# Patient Record
Sex: Female | Born: 1975 | Race: White | Hispanic: No | Marital: Married
Health system: Southern US, Community
[De-identification: ages and names within clinical notes are randomized; demographics above are authoritative.]

---

## 2018-04-03 ENCOUNTER — Other Ambulatory Visit: Payer: Self-pay | Admitting: Family Medicine

## 2018-04-03 DIAGNOSIS — Z8639 Personal history of other endocrine, nutritional and metabolic disease: Secondary | ICD-10-CM

## 2018-04-16 ENCOUNTER — Ambulatory Visit
Admission: RE | Admit: 2018-04-16 | Discharge: 2018-04-16 | Disposition: A | Discharge status: Home / Self Care | Payer: BLUE CROSS/BLUE SHIELD | Source: Ambulatory Visit | Attending: Family Medicine | Admitting: Family Medicine

## 2018-04-16 DIAGNOSIS — Z8639 Personal history of other endocrine, nutritional and metabolic disease: Secondary | ICD-10-CM

## 2019-10-31 ENCOUNTER — Ambulatory Visit: Payer: Self-pay | Attending: Internal Medicine

## 2019-10-31 DIAGNOSIS — Z23 Encounter for immunization: Secondary | ICD-10-CM | POA: Insufficient documentation

## 2019-10-31 NOTE — Progress Notes (Signed)
   Cov[facility] Vaccination [facility]  Name:  Ross Hefferan    MRN: [id number] DOB: December 09, 1975  10/31/2019  Ms. Saephanh was observed post Covid-19 immunization for 15 minutes without incidence. She was provided with Vaccine Information Sheet and instruction to access the V-Safe system.   Ms. Horseman was instructed to call 911 with any severe reactions post vaccine: Marland Kitchen Difficulty breathing  . Swelling of your face and throat  . A fast heartbeat  . A bad rash all over your body  . Dizziness and weakness    Immunizations Administered    Name Date Dose VIS Date Route   Pfizer COVID-19 Vaccine 10/31/2019  3:42 PM 0.3 mL 08/14/2019 Intramuscular   Manufacturer: [employer], [employer]   Lot: [id number]   NDC: [address]-1

## 2019-11-21 ENCOUNTER — Ambulatory Visit: Payer: Self-pay | Attending: Internal Medicine

## 2019-11-21 ENCOUNTER — Other Ambulatory Visit: Payer: Self-pay

## 2019-11-21 DIAGNOSIS — Z23 Encounter for immunization: Secondary | ICD-10-CM

## 2019-11-21 NOTE — Progress Notes (Signed)
   Covid-19 Vaccination Clinic  Name:  Zaara Sprowl    MRN: [id number] DOB: Dec 02, 1975  11/21/2019  Ms. Lobdell was observed post Covid-19 immunization for 15 minutes without incident. She was provided with Vaccine Information Sheet and instruction to access the V-Safe system.   Ms. Shindler was instructed to call 911 with any severe reactions post vaccine: Marland Kitchen Difficulty breathing  . Swelling of face and throat  . A fast heartbeat  . A bad rash all over body  . Dizziness and weakness   Immunizations Administered    Name Date Dose VIS Date Route   Pfizer COVID-19 Vaccine 11/21/2019  8:37 AM 0.3 mL 08/14/2019 Intramuscular   Manufacturer: [employer], [employer]   Lot: [id number]   NDC: [address]-1

## 2020-05-13 IMAGING — US US THYROID
1 series · 14 of 25 positions shown · non-contrast
Comparison: None.

CLINICAL DATA: Nodule

EXAM:
THYROID ULTRASOUND
TECHNIQUE: Ultrasound examination of the thyroid gland and adjacent soft
tissues was performed.

[Series 1: us thyroid · 0.04mm/px · 14 of 51 slices shown]
[im 1/51]
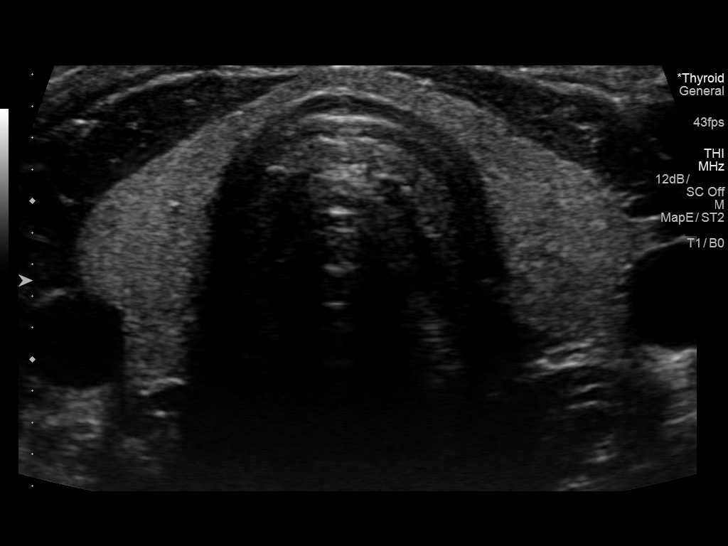
[im 5/51]
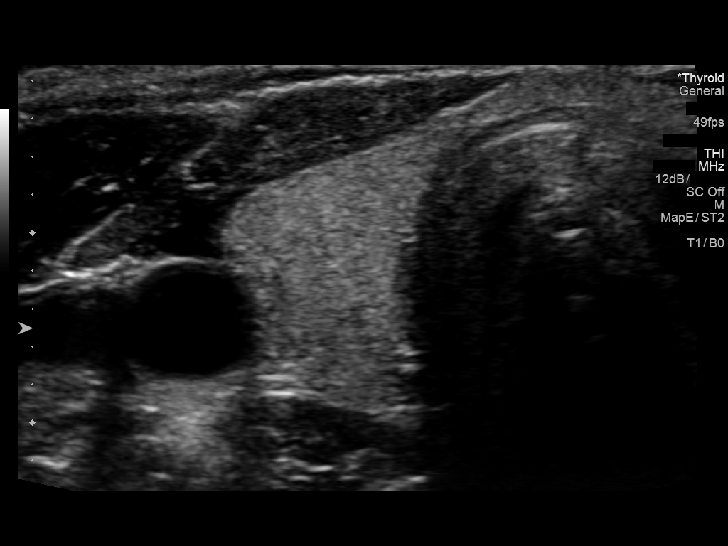
[im 9/51]
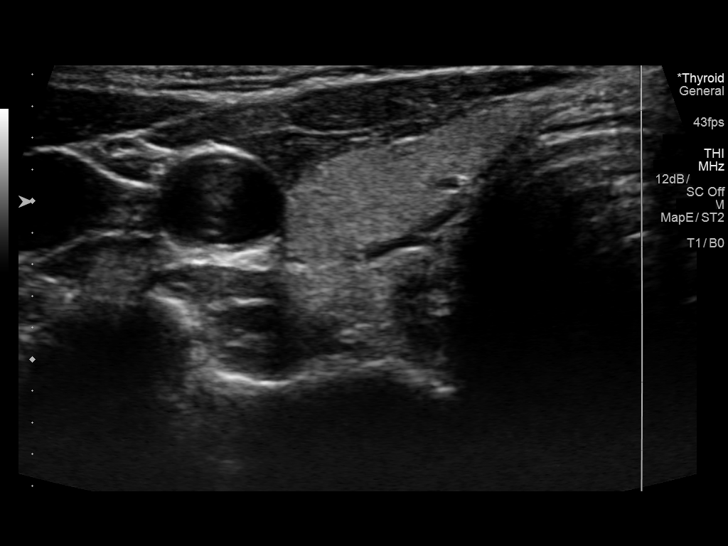
[im 13/51]
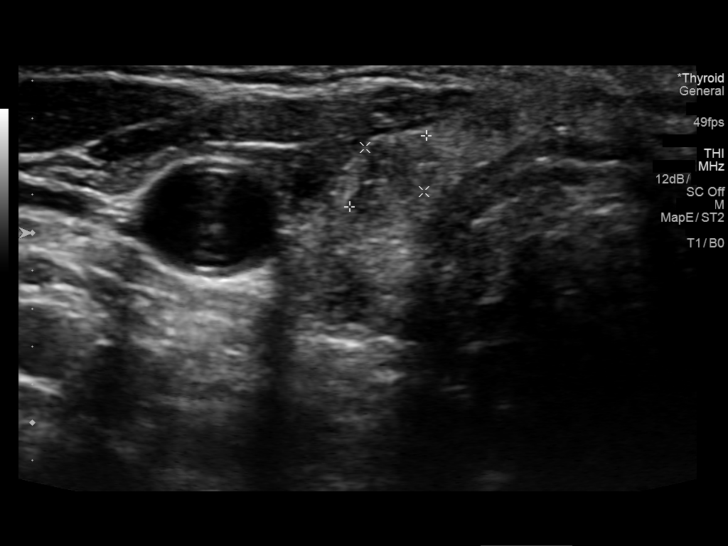
[im 17/51]
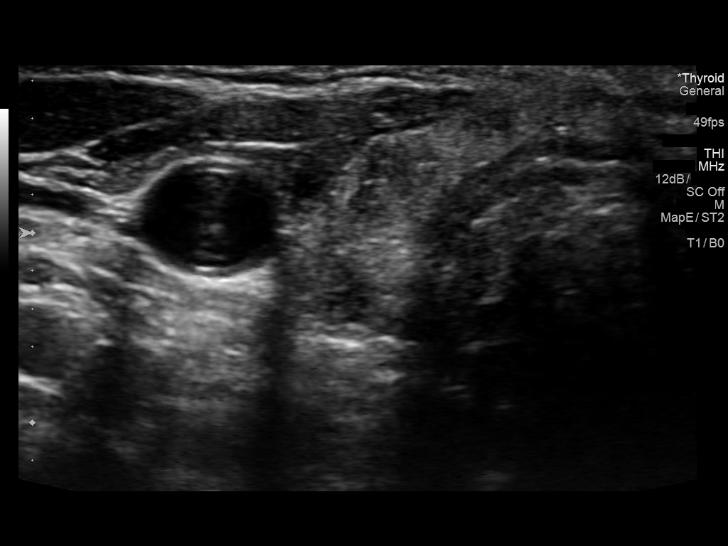
[im 19/51]
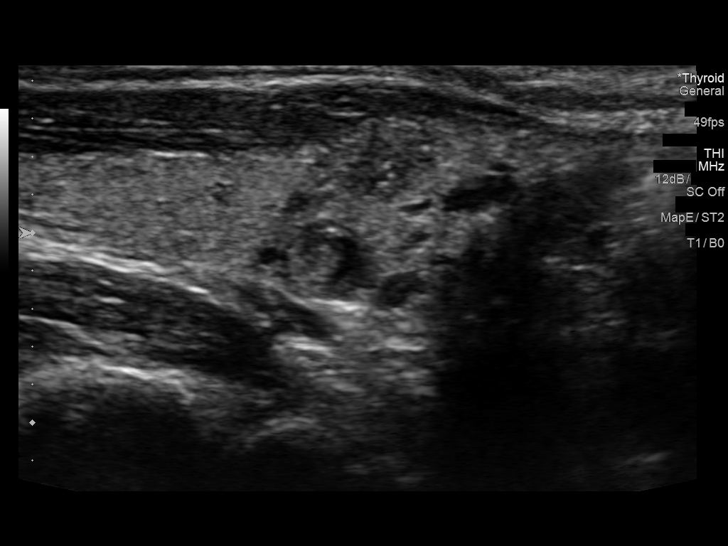
[im 23/51]
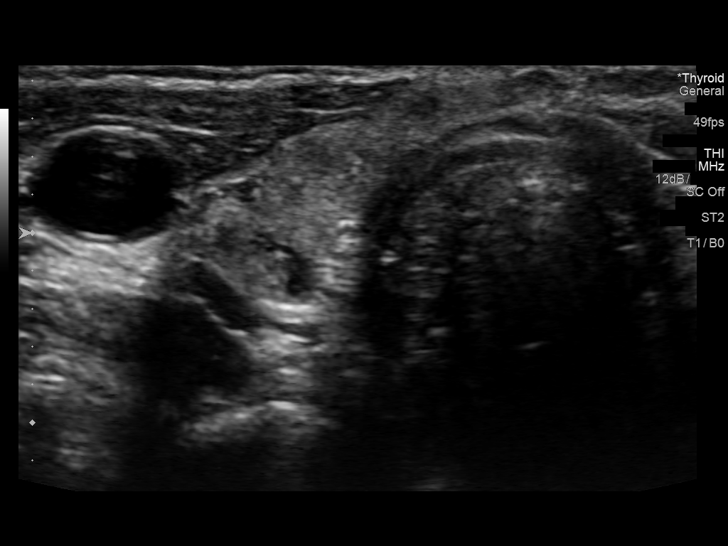
[im 28/51]
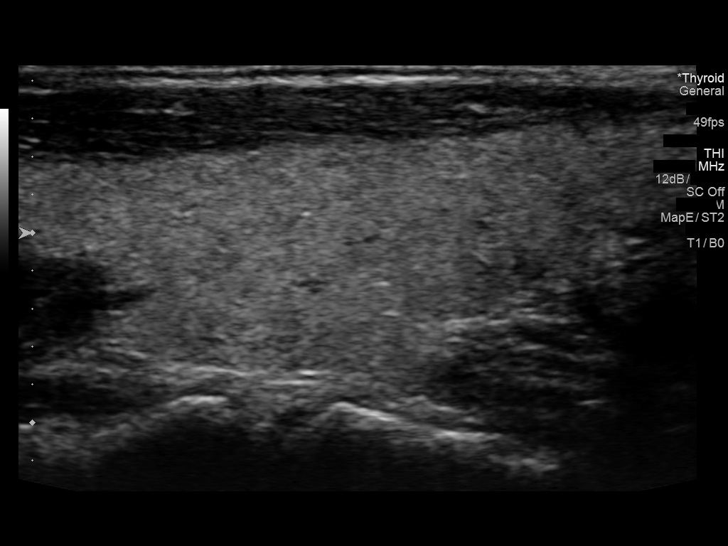
[im 32/51]
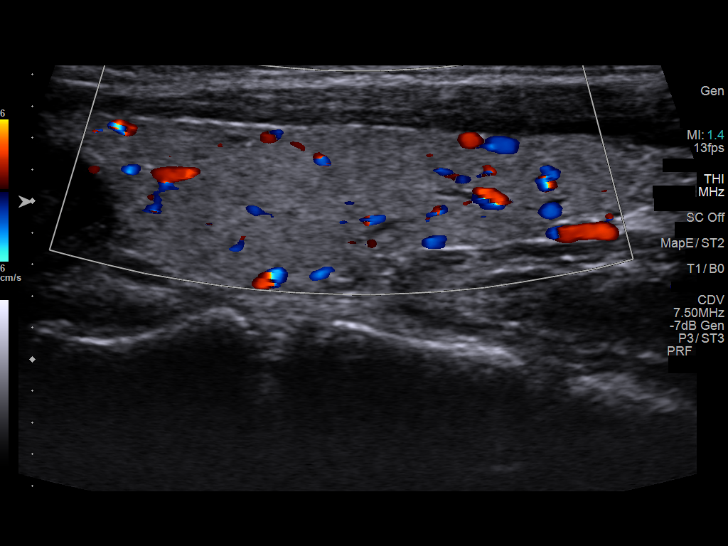
[im 34/51]
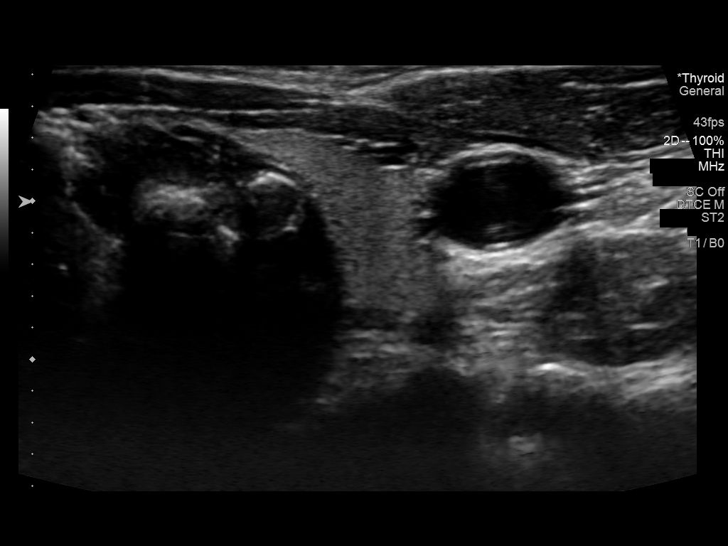
[im 38/51]
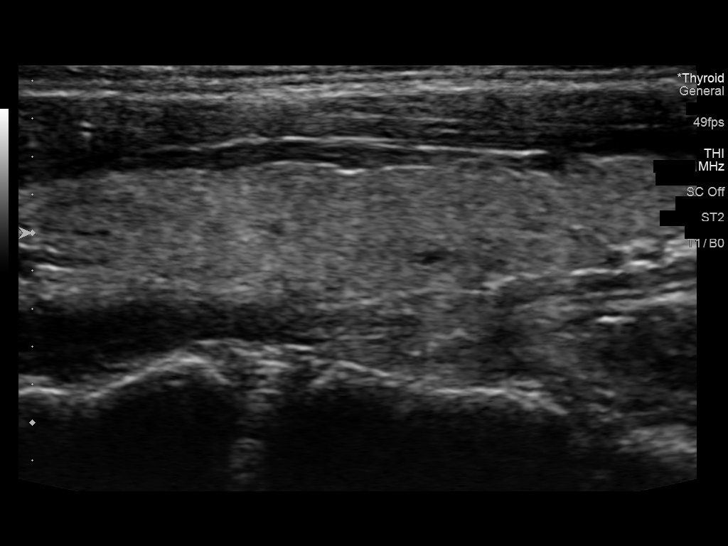
[im 42/51]
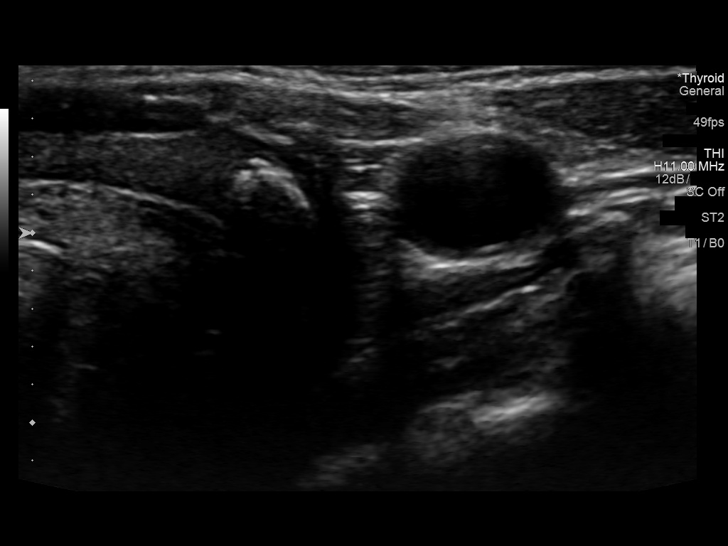
[im 46/51]
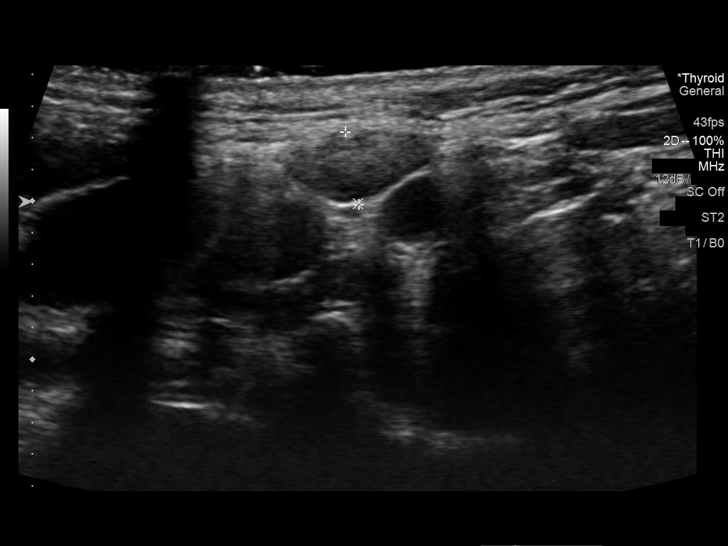
[im 51/51]
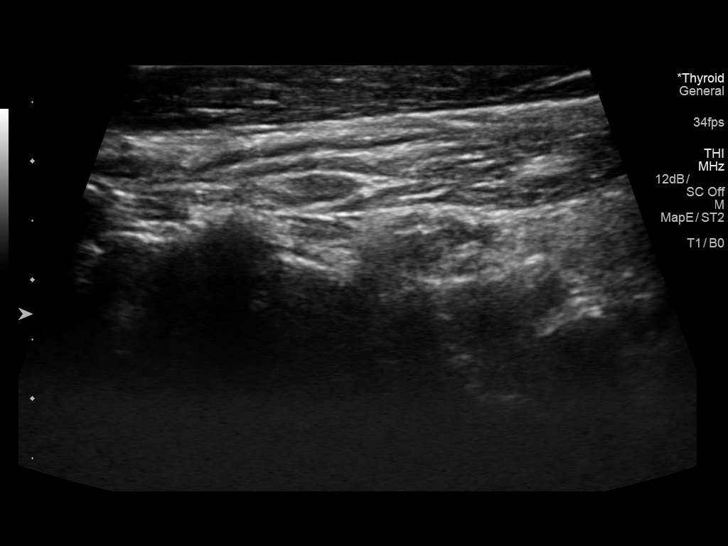

[14 of 25 positions shown; findings below may reference images not displayed]

FINDINGS: Parenchymal Echotexture: Mildly heterogenous

Isthmus: 0.2 cm thickness

Right lobe: 4.3 x 0.9 x 1.1 cm

Left lobe: 4.2 x 0.9 x 1.2 cm

_________________________________________________________

Estimated total number of nodules >/= 1 cm: 0

Number of spongiform nodules >/=  2 cm not described below (TR1): 0

Number of mixed cystic and solid nodules >/= 1.5 cm not described
below (TR2): 0

_________________________________________________________

0.7 cm hypoechoic nodule, inferior right, and smaller scattered
bilateral hypoechoic and cystic lesions, none of which meet criteria
for biopsy or dedicated imaging follow-up.
IMPRESSION: 1. Normal-sized thyroid with scattered subcentimeter nodules. No
indication for biopsy or dedicated imaging follow-up.

The above is in keeping with the ACR TI-RADS recommendations - [HOSPITAL] 0924;[DATE].
